# Patient Record
Sex: Male | Born: 1975 | Race: White | Hispanic: No | Marital: Married | Smoking: Never smoker
Health system: Southern US, Community
[De-identification: ages and names within clinical notes are randomized; demographics above are authoritative.]

## PROBLEM LIST (undated history)

## (undated) DIAGNOSIS — E079 Disorder of thyroid, unspecified: Secondary | ICD-10-CM

## (undated) HISTORY — PX: CHOLECYSTECTOMY: SHX55

## (undated) HISTORY — PX: KNEE ARTHROSCOPY: SUR90

## (undated) HISTORY — DX: Disorder of thyroid, unspecified: E07.9

---

## 2006-12-22 ENCOUNTER — Ambulatory Visit: Payer: Self-pay | Admitting: Internal Medicine

## 2006-12-27 ENCOUNTER — Other Ambulatory Visit: Payer: Self-pay

## 2006-12-27 ENCOUNTER — Emergency Department: Payer: Self-pay | Admitting: Emergency Medicine

## 2007-01-02 ENCOUNTER — Ambulatory Visit: Payer: Self-pay | Admitting: Vascular Surgery

## 2014-11-12 ENCOUNTER — Ambulatory Visit: Payer: Self-pay | Admitting: Internal Medicine

## 2015-06-15 IMAGING — US US EXTREM LOW VENOUS*L*
1 series · 13 of 24 positions shown · non-contrast
Comparison: None.

CLINICAL DATA: Left lower extremity popliteal fossa pain and
swelling.



[Series 1: us extrem low venous*left* · 0.09mm/px · 13 of 33 slices shown]
[im 1/33]
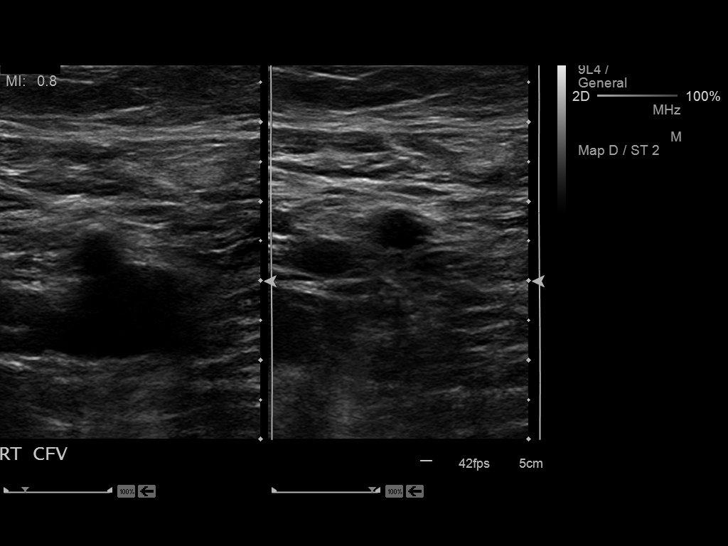
[im 3/33]
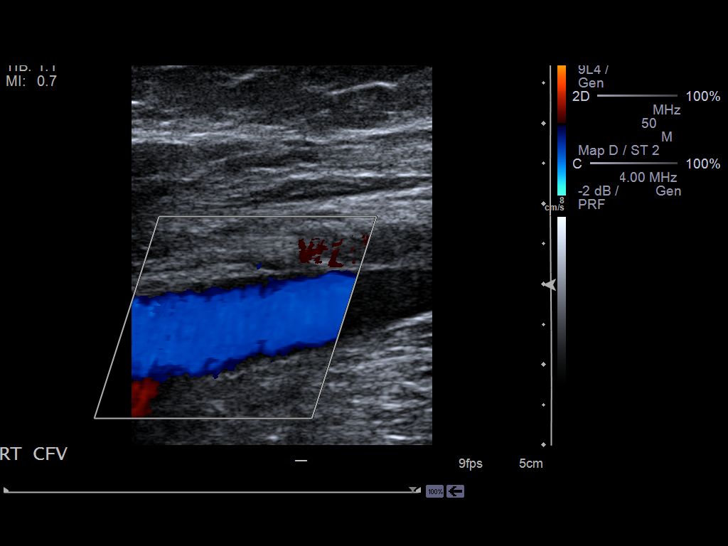
[im 6/33]
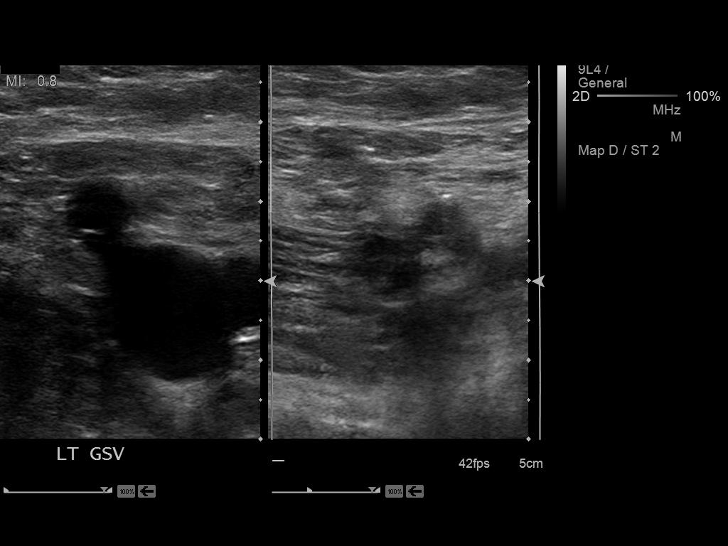
[im 9/33]
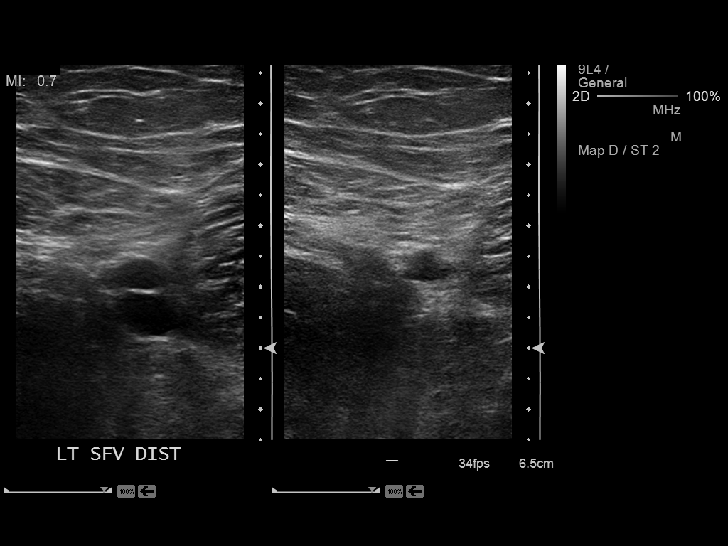
[im 12/33]
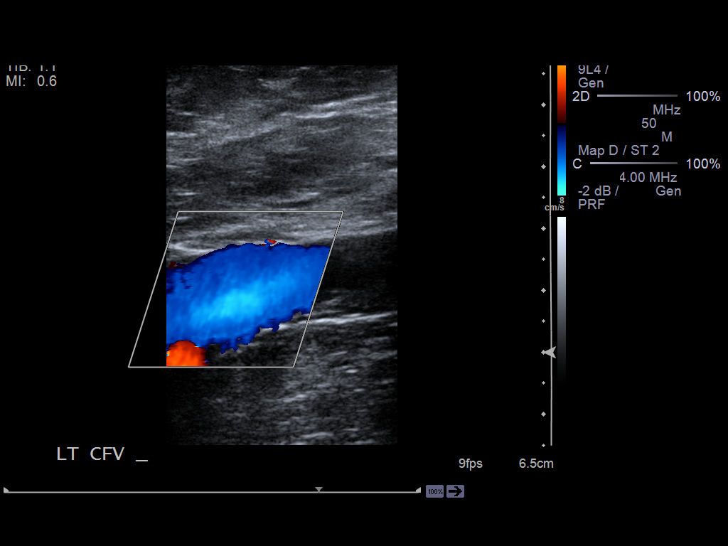
[im 14/33]
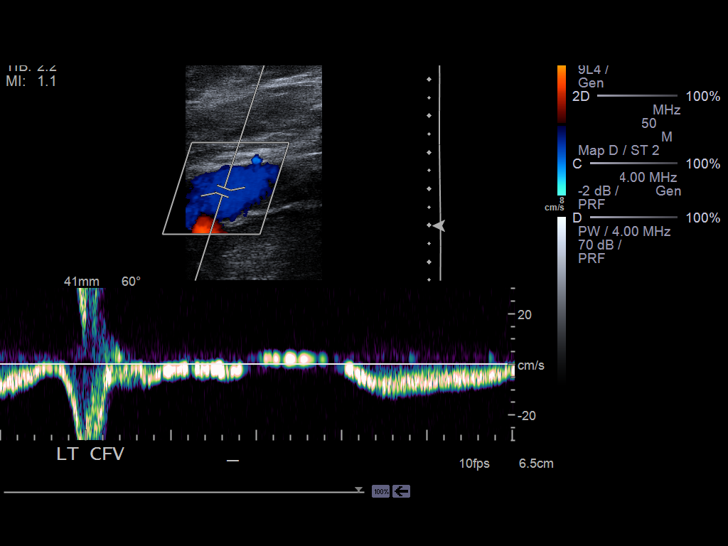
[im 17/33]
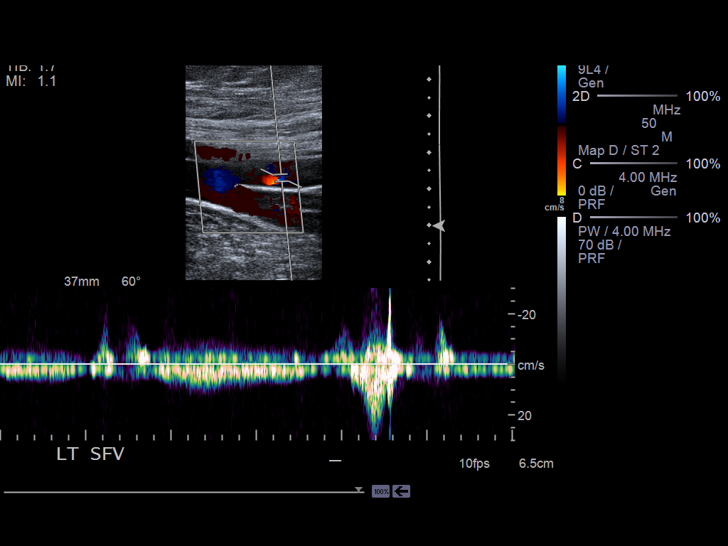
[im 19/33]
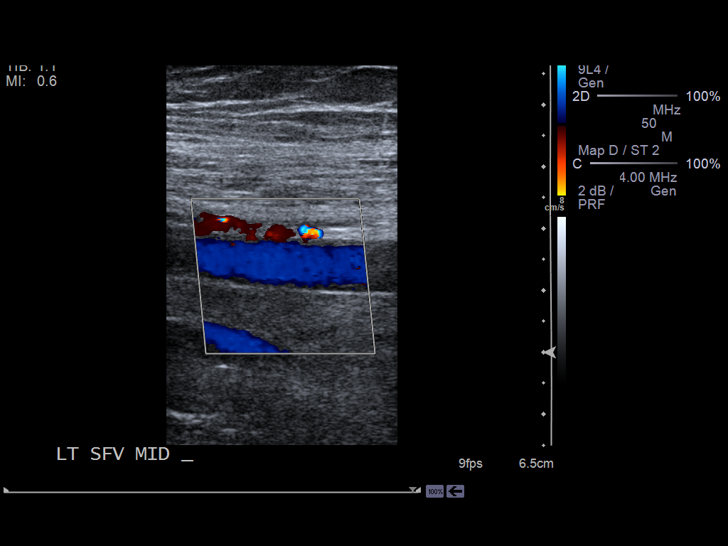
[im 21/33]
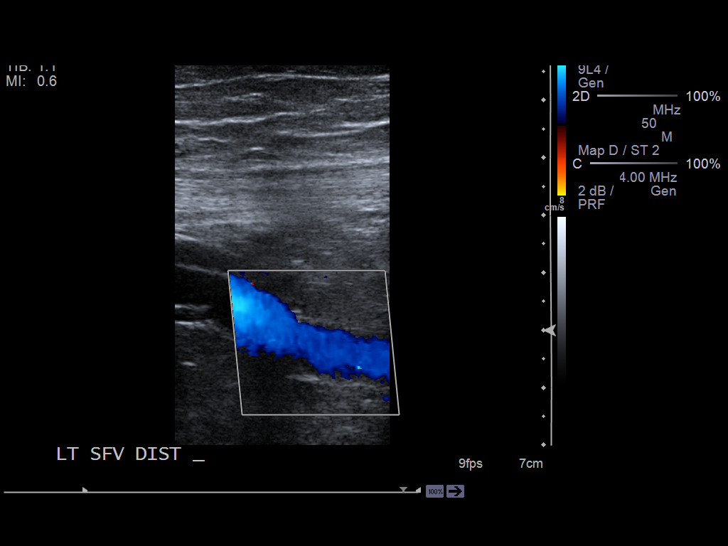
[im 24/33]
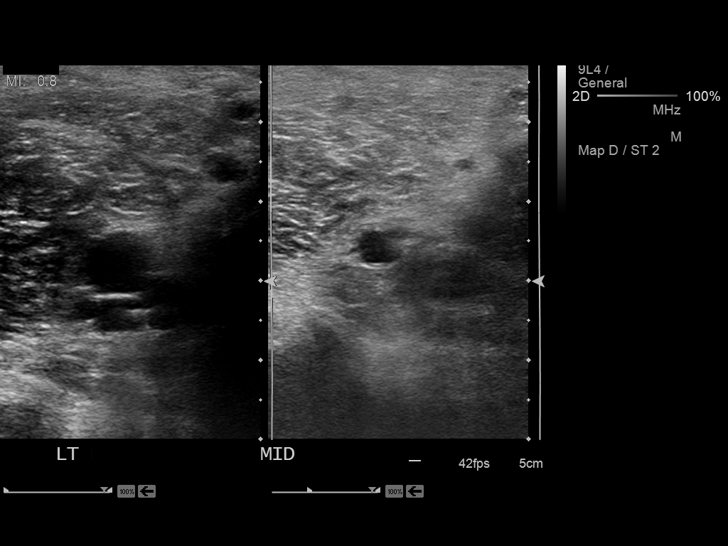
[im 27/33]
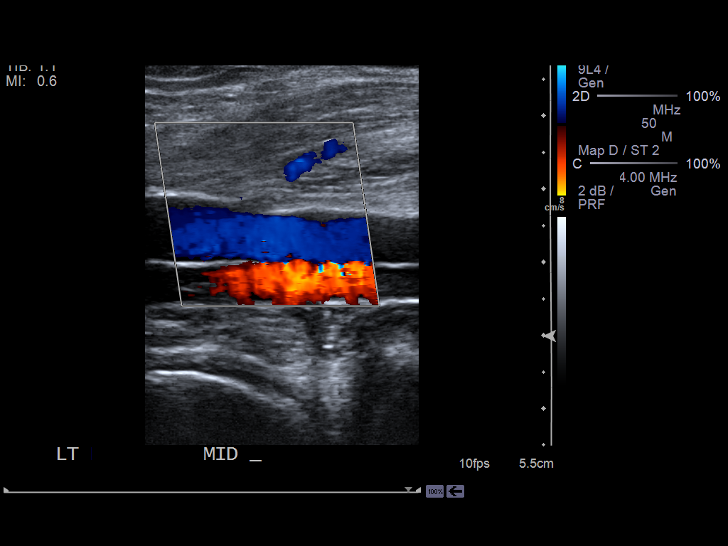
[im 30/33]
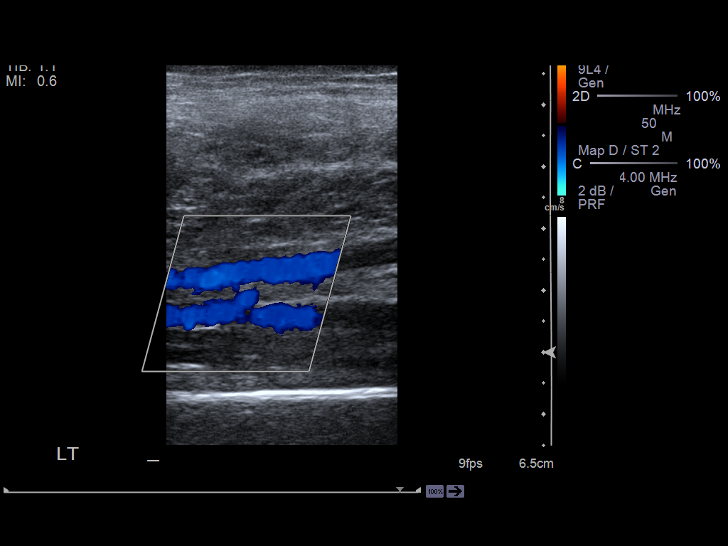
[im 33/33]
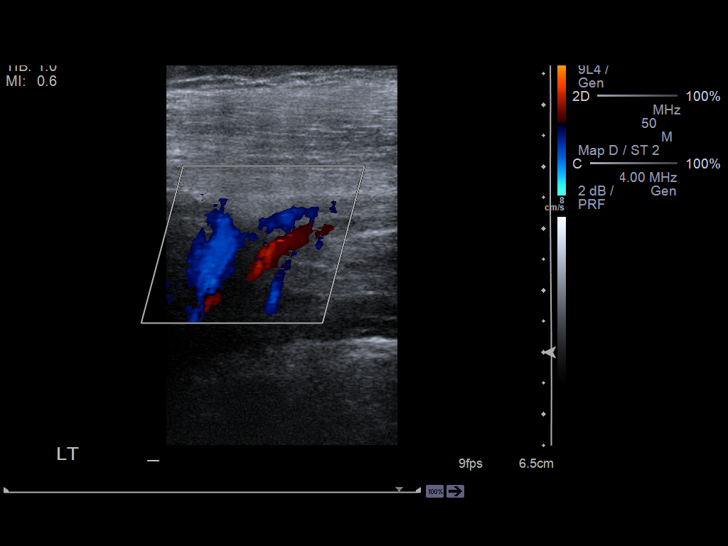

[13 of 24 positions shown; findings below may reference images not displayed]

FINDINGS: Contralateral Common Femoral Vein: Respiratory phasicity is normal
and symmetric with the symptomatic side. No evidence of thrombus.
Normal compressibility.

Common Femoral Vein: No evidence of thrombus. Normal
compressibility, respiratory phasicity and response to augmentation.

Saphenofemoral Junction: No evidence of thrombus. Normal
compressibility and flow on color Doppler imaging.

Profunda Femoral Vein: No evidence of thrombus. Normal
compressibility and flow on color Doppler imaging.

Femoral Vein: No evidence of thrombus. Normal compressibility,
respiratory phasicity and response to augmentation.

Popliteal Vein: No evidence of thrombus. Normal compressibility,
respiratory phasicity and response to augmentation.

Calf Veins: No evidence of thrombus. Normal compressibility and flow
on color Doppler imaging.

Superficial Great Saphenous Vein: No evidence of thrombus. Normal
compressibility and flow on color Doppler imaging.

Venous Reflux:  None.

Other Findings:  None.
IMPRESSION: No evidence of deep venous thrombosis.

## 2016-12-07 ENCOUNTER — Ambulatory Visit (INDEPENDENT_AMBULATORY_CARE_PROVIDER_SITE_OTHER): Payer: BLUE CROSS/BLUE SHIELD

## 2016-12-07 ENCOUNTER — Encounter: Payer: Self-pay | Admitting: Podiatry

## 2016-12-07 ENCOUNTER — Ambulatory Visit (INDEPENDENT_AMBULATORY_CARE_PROVIDER_SITE_OTHER): Payer: BLUE CROSS/BLUE SHIELD | Admitting: Podiatry

## 2016-12-07 DIAGNOSIS — M79672 Pain in left foot: Secondary | ICD-10-CM | POA: Diagnosis not present

## 2016-12-07 DIAGNOSIS — M722 Plantar fascial fibromatosis: Secondary | ICD-10-CM | POA: Diagnosis not present

## 2016-12-07 MED ORDER — MELOXICAM 15 MG PO TABS: 15.0000 mg | ORAL_TABLET | Freq: Every day | ORAL | 3 refills | Status: DC

## 2016-12-07 MED ORDER — METHYLPREDNISOLONE 4 MG PO TBPK: ORAL_TABLET | ORAL | 0 refills | Status: DC

## 2016-12-07 NOTE — Progress Notes (Signed)
He presents today with a chief complaint of pain to the left lateral foot and heel times past 2 years. He states that he has a history of plantar fasciitis. At that time the plantar fascia was more of the problem rather than the heel itself. He states the pain is sharp and stabbing when he is walking status is very painful in the mornings and after laying down. No treatment use recently.  Objective: Vital signs are stable alert and oriented 3. Pulses are palpable. Neurologic sensorium is intact. Degenerative reflexes are intact. Muscle strength is normal bilateral. Orthopedic evaluation demonstrates all joints distal to the ankle for range of motion without crepitation. Cutaneous evaluation Mr. is a well-hydrated cutis no open lesions or wounds. He has moderate severe pain on palpation medial calcaneal tubercle of the left heel. No pain on medial and lateral compression of the calcaneus.  Assessment: Plantar fasciitis left.  Plan: Start him on a Medrol Dosepak to be followed by meloxicam. Injected the left heel today with Kenalog and local anesthetic placed in a plantar fascia brace and a night splint. Follow up with him in 1 month. Discussed appropriate shoe gear stretching exercises ice therapy and shoe gear modifications.

## 2016-12-07 NOTE — Patient Instructions (Signed)

## 2017-01-04 ENCOUNTER — Ambulatory Visit: Payer: BLUE CROSS/BLUE SHIELD | Admitting: Podiatry

## 2020-01-24 ENCOUNTER — Ambulatory Visit: Payer: Self-pay | Attending: Internal Medicine

## 2020-01-28 ENCOUNTER — Ambulatory Visit: Payer: Self-pay | Attending: Internal Medicine

## 2020-01-28 DIAGNOSIS — Z23 Encounter for immunization: Secondary | ICD-10-CM

## 2020-01-28 NOTE — Progress Notes (Signed)
   Covid-19 Vaccination Clinic  Name:  Peter Hansen    MRN: [id number] DOB: 1975/10/12  01/28/2020  [name] was observed post Covid-19 immunization for 15 minutes without incident. He was provided with Vaccine Information Sheet and instruction to access the V-Safe system.   [name] was instructed to call 911 with any severe reactions post vaccine: Marland Kitchen Difficulty breathing  . Swelling of face and throat  . A fast heartbeat  . A bad rash all over body  . Dizziness and weakness   Immunizations Administered    Name Date Dose VIS Date Route   Pfizer COVID-19 Vaccine 01/28/2020  3:07 PM 0.3 mL 11/13/2018 Intramuscular   Manufacturer: [employer], [employer]   Lot: [id number]   NDC: [address]-9

## 2020-02-18 ENCOUNTER — Ambulatory Visit: Payer: Self-pay | Attending: Internal Medicine

## 2020-02-18 DIAGNOSIS — Z23 Encounter for immunization: Secondary | ICD-10-CM

## 2020-02-18 NOTE — Progress Notes (Signed)
   Covid-19 Vaccination Clinic  Name:  Peter Hansen    MRN: [id number] DOB: 02-Oct-1975  02/18/2020  Peter Hansen was observed post Covid-19 immunization for 15 minutes without incident. He was provided with Vaccine Information Sheet and instruction to access the V-Safe system.   Peter Hansen was instructed to call 911 with any severe reactions post vaccine: Marland Kitchen Difficulty breathing  . Swelling of face and throat  . A fast heartbeat  . A bad rash all over body  . Dizziness and weakness   Immunizations Administered    Name Date Dose VIS Date Route   Pfizer COVID-19 Vaccine 02/18/2020 12:12 PM 0.3 mL 11/13/2018 Intramuscular   Manufacturer: [employer], [employer]   Lot: [id number]   NDC: [id number]

## 2020-04-13 ENCOUNTER — Encounter: Payer: Self-pay | Admitting: Podiatry

## 2020-04-13 ENCOUNTER — Ambulatory Visit: Payer: BC Managed Care – PPO | Admitting: Podiatry

## 2020-04-13 ENCOUNTER — Other Ambulatory Visit: Payer: Self-pay

## 2020-04-13 DIAGNOSIS — B070 Plantar wart: Secondary | ICD-10-CM | POA: Diagnosis not present

## 2020-04-13 DIAGNOSIS — L989 Disorder of the skin and subcutaneous tissue, unspecified: Secondary | ICD-10-CM | POA: Diagnosis not present

## 2020-04-13 DIAGNOSIS — B079 Viral wart, unspecified: Secondary | ICD-10-CM | POA: Insufficient documentation

## 2020-04-13 NOTE — Progress Notes (Addendum)
This patient presents to the office with painful skin lesion under big toe joint left foot.  Patient says the skin lesion has been present for 4 months.  He says he believes he has a wart and has treated the skin lesion with debridement, soaks  and OTC acid.  Problem persists.  Patient says pain occurs when he walks.  He presents to the office for evaluation and treatment of skin lesion left foot.  Vascular  Dorsalis pedis and posterior tibial pulses are palpable  B/L.  Capillary return  WNL.  Temperature gradient is  WNL.  Skin turgor  WNL  Sensorium  Senn Weinstein monofilament wire  WNL. Normal tactile sensation.  Nail Exam  Patient has normal nails with no evidence of bacterial or fungal infection.  Orthopedic  Exam  Muscle tone and muscle strength  WNL.  No limitations of motion feet  B/L.  No crepitus or joint effusion noted.  Foot type is unremarkable and digits show no abnormalities.  Bony prominences are unremarkable.  Skin  No open lesions.  Normal skin texture and turgor. Well defined well circumscribed lesion left forefoot  With black thrombi in the center of skin lesion.  Benign skin lesion   Plantar wart left forefoot.    IE.  Discussed conservative vs. Surgical treatment.  Patient opts for excision of skin lesion under local anesthesia.  This patient was anesthetized with mixture of 2% lidocaine plain and 2 % lidocaine with epi. at the site of the skin lesion.  The surgical site was then washed with betadine and alcohol.  Using a punch the lesion was excised and passed off as specimen.  The surgical site was cauterized with phenol and washed with alcohol.  The site was bandaged with neosporin, sterile 2x2 and kling.  Home instructions given.  RTC 10 days.  The skin lesion was not sent to the lab after discussing possibly  sending the skin lesion to the lab.   Helane Gunther DPM

## 2020-04-23 ENCOUNTER — Ambulatory Visit: Payer: BC Managed Care – PPO | Admitting: Podiatry

## 2020-09-28 ENCOUNTER — Ambulatory Visit: Payer: Self-pay | Attending: Internal Medicine

## 2020-09-28 DIAGNOSIS — Z23 Encounter for immunization: Secondary | ICD-10-CM

## 2020-09-28 NOTE — Progress Notes (Signed)
   Covid-19 Vaccination Clinic  Name:  Peter Hansen    MRN: [id number] DOB: 11-09-1975  09/28/2020  [name] was observed post Covid-19 immunization for 15 minutes without incident. He was provided with Vaccine Information Sheet and instruction to access the V-Safe system.   [name] was instructed to call 911 with any severe reactions post vaccine: Marland Kitchen Difficulty breathing  . Swelling of face and throat  . A fast heartbeat  . A bad rash all over body  . Dizziness and weakness   Immunizations Administered    Name Date Dose VIS Date Route   Pfizer COVID-19 Vaccine 09/28/2020  3:36 PM 0.3 mL 07/08/2020 Intramuscular   Manufacturer: [employer], [employer]   Lot: [id number]   NDC: [address]-9

## 2021-11-22 ENCOUNTER — Ambulatory Visit
Admission: RE | Admit: 2021-11-22 | Discharge: 2021-11-22 | Disposition: A | Discharge status: Home / Self Care | Payer: BC Managed Care – PPO | Source: Ambulatory Visit | Attending: Internal Medicine | Admitting: Internal Medicine

## 2021-11-22 ENCOUNTER — Other Ambulatory Visit: Payer: Self-pay | Admitting: Internal Medicine

## 2021-11-22 DIAGNOSIS — M545 Low back pain, unspecified: Secondary | ICD-10-CM

## 2023-03-06 ENCOUNTER — Ambulatory Visit: Payer: BC Managed Care – PPO | Admitting: Podiatry

## 2023-03-06 ENCOUNTER — Encounter: Payer: Self-pay | Admitting: Podiatry

## 2023-03-06 DIAGNOSIS — D2371 Other benign neoplasm of skin of right lower limb, including hip: Secondary | ICD-10-CM

## 2023-03-06 DIAGNOSIS — M7751 Other enthesopathy of right foot: Secondary | ICD-10-CM | POA: Diagnosis not present

## 2023-03-06 MED ORDER — DEXAMETHASONE SODIUM PHOSPHATE 120 MG/30ML IJ SOLN
2.0000 mg | Freq: Once | INTRAMUSCULAR | Status: AC
  Administered 2023-03-06: 2 mg via INTRA_ARTICULAR

## 2023-03-06 NOTE — Progress Notes (Signed)
He presents today chief complaint of a painful lesion beneath the fifth metatarsal head of the right foot.  States that it is callused and painful area times past 2 months possibly wart and since he has had 1 before.  He tried to freeze away to the nail.  Objective: Vital signs stable alert oriented x 3 there is no erythema edema cellulitis drainage or odor is fluctuance beneath the fifth metatarsal head of the right foot.  There is a small porokeratotic lesion which I was able to sharply removed today.  Assessment: Bursitis subfifth met head right.  Benign skin lesion subfifth right.  Plan: Discussed etiology pathology and surgical therapies injected 3 mg of dexamethasone local anesthetic beneath the lesion today into the bursa.  I also was able to remove the nucleus from the benign skin lesion placed a Band-Aid no iatrogenic lesions.  Will follow-up with him on an as-needed basis discussed appropriate shoe gear

## 2023-10-04 ENCOUNTER — Telehealth: Payer: Self-pay

## 2023-10-04 NOTE — Telephone Encounter (Signed)
 Colonoscopy referral was received from Dr. Harding Li office.  Chart reviewed.  Patient was contacted to advise that if he would like his colonoscopy performed with our providers I will need to get approval due to the fact that he was seen by [facility] 05/14/21.  Explained to him that he is still an established patient with [facility] since he within the past 3 years.  Informed him that this is not a guarantee that we are able to perform his colonoscopy.  Please advise if either of you are willing to perform colonoscopy.  Thanks,  [employer], [place]

## 2023-10-09 ENCOUNTER — Other Ambulatory Visit: Payer: Self-pay

## 2023-10-09 ENCOUNTER — Telehealth: Payer: Self-pay

## 2023-10-09 DIAGNOSIS — Z1211 Encounter for screening for malignant neoplasm of colon: Secondary | ICD-10-CM

## 2023-10-09 MED ORDER — NA SULFATE-K SULFATE-MG SULF 17.5-3.13-1.6 GM/177ML PO SOLN: 1.0000 | Freq: Once | ORAL | 0 refills | Status: AC

## 2023-10-09 NOTE — Telephone Encounter (Signed)
Gastroenterology Pre-Procedure Review  Request Date: 10/24/23 Requesting Physician: Dr. Tobi Bastos  PATIENT REVIEW QUESTIONS: The patient responded to the following health history questions as indicated:    1. Are you having any GI issues? no 2. Do you have a personal history of Polyps? no 3. Do you have a family history of Colon Cancer or Polyps? no 4. Diabetes Mellitus? no 5. Joint replacements in the past 12 months?no 6. Major health problems in the past 3 months?no 7. Any artificial heart valves, MVP, or defibrillator?no    MEDICATIONS & ALLERGIES:    Patient reports the following regarding taking any anticoagulation/antiplatelet therapy:   Plavix, Coumadin, Eliquis, Xarelto, Lovenox, Pradaxa, Brilinta, or Effient? no Aspirin? no  Patient confirms/reports the following medications:  Current Outpatient Medications  Medication Sig Dispense Refill   levothyroxine (SYNTHROID) 137 MCG tablet Take 137 mcg by mouth daily.     No current facility-administered medications for this visit.    Patient confirms/reports the following allergies:  No Known Allergies  No orders of the defined types were placed in this encounter.   AUTHORIZATION INFORMATION Primary Insurance: 1D#: Group #:  Secondary Insurance: 1D#: Group #:  SCHEDULE INFORMATION: Date: 10/24/23 Time: Location: [facility]

## 2023-10-18 ENCOUNTER — Encounter: Payer: Self-pay | Admitting: Internal Medicine

## 2023-10-24 ENCOUNTER — Encounter
Admission: RE | Disposition: A | Discharge status: Home / Self Care | Payer: Self-pay | Source: Home / Self Care | Attending: Gastroenterology

## 2023-10-24 ENCOUNTER — Encounter: Payer: Self-pay | Admitting: Gastroenterology

## 2023-10-24 ENCOUNTER — Ambulatory Visit: Payer: BC Managed Care – PPO | Admitting: Anesthesiology

## 2023-10-24 ENCOUNTER — Ambulatory Visit
Admission: RE | Admit: 2023-10-24 | Discharge: 2023-10-24 | Disposition: A | Discharge status: Home / Self Care | Payer: BC Managed Care – PPO | Attending: Gastroenterology | Admitting: Gastroenterology

## 2023-10-24 DIAGNOSIS — E039 Hypothyroidism, unspecified: Secondary | ICD-10-CM | POA: Insufficient documentation

## 2023-10-24 DIAGNOSIS — Z79890 Hormone replacement therapy (postmenopausal): Secondary | ICD-10-CM | POA: Insufficient documentation

## 2023-10-24 DIAGNOSIS — D125 Benign neoplasm of sigmoid colon: Secondary | ICD-10-CM | POA: Insufficient documentation

## 2023-10-24 DIAGNOSIS — D123 Benign neoplasm of transverse colon: Secondary | ICD-10-CM | POA: Insufficient documentation

## 2023-10-24 DIAGNOSIS — D126 Benign neoplasm of colon, unspecified: Secondary | ICD-10-CM

## 2023-10-24 DIAGNOSIS — Z1211 Encounter for screening for malignant neoplasm of colon: Secondary | ICD-10-CM | POA: Diagnosis present

## 2023-10-24 DIAGNOSIS — K635 Polyp of colon: Secondary | ICD-10-CM

## 2023-10-24 DIAGNOSIS — D120 Benign neoplasm of cecum: Secondary | ICD-10-CM | POA: Diagnosis not present

## 2023-10-24 HISTORY — PX: COLONOSCOPY WITH PROPOFOL: SHX5780

## 2023-10-24 HISTORY — PX: POLYPECTOMY: SHX5525

## 2023-10-24 SURGERY — COLONOSCOPY WITH PROPOFOL
Anesthesia: General

## 2023-10-24 MED ORDER — SODIUM CHLORIDE 0.9 % IV SOLN: INTRAVENOUS | Status: DC

## 2023-10-24 MED ORDER — STERILE WATER FOR IRRIGATION IR SOLN
Status: DC | PRN
  Administered 2023-10-24 (×2): 60 mL

## 2023-10-24 MED ORDER — PROPOFOL 10 MG/ML IV BOLUS
INTRAVENOUS | Status: DC | PRN
  Administered 2023-10-24: 70 mg via INTRAVENOUS

## 2023-10-24 MED ORDER — PROPOFOL 500 MG/50ML IV EMUL
INTRAVENOUS | Status: DC | PRN
  Administered 2023-10-24: 140 ug/kg/min via INTRAVENOUS

## 2023-10-24 MED ORDER — SODIUM CHLORIDE 0.9% FLUSH: 3.0000 mL | INTRAVENOUS | Status: DC | PRN

## 2023-10-24 MED ORDER — SODIUM CHLORIDE 0.9% FLUSH: 3.0000 mL | Freq: Two times a day (BID) | INTRAVENOUS | Status: DC

## 2023-10-24 NOTE — Anesthesia Postprocedure Evaluation (Signed)
 Anesthesia Post Note  Patient: Peter Hansen  Procedure(s) Performed: COLONOSCOPY WITH PROPOFOL  POLYPECTOMY  Patient location during evaluation: Endoscopy Anesthesia Type: General Level of consciousness: awake and alert Pain management: pain level controlled Vital Signs Assessment: post-procedure vital signs reviewed and stable Respiratory status: spontaneous breathing, nonlabored ventilation, respiratory function stable and patient connected to nasal cannula oxygen Cardiovascular status: blood pressure returned to baseline and stable Postop Assessment: no apparent nausea or vomiting Anesthetic complications: no   No notable events documented.   Last Vitals:  Vitals:   10/24/23 1200 10/24/23 1203  BP:  115/82  Pulse:    Resp:  (!) 22  Temp:    SpO2: (P) 100% 98%    Last Pain:  Vitals:   10/24/23 1143  TempSrc: Temporal                 [name]

## 2023-10-24 NOTE — Transfer of Care (Signed)
 Immediate Anesthesia Transfer of Care Note  Patient: Peter Hansen  Procedure(s) Performed: COLONOSCOPY WITH PROPOFOL  POLYPECTOMY  Patient Location: PACU  Anesthesia Type:General  Level of Consciousness: awake, alert , and oriented  Airway & Oxygen Therapy: Patient Spontanous Breathing  Post-op Assessment: Report given to RN and Post -op Vital signs reviewed and stable  Post vital signs: Reviewed and stable  Last Vitals:  Vitals Value Taken Time  BP 100/61 10/24/23 1143  Temp 35.8 C 10/24/23 1143  Pulse 67 10/24/23 1146  Resp 12 10/24/23 1146  SpO2 100 % 10/24/23 1146  Vitals shown include unfiled device data.  Last Pain:  Vitals:   10/24/23 1143  TempSrc: Temporal         Complications: No notable events documented.

## 2023-10-24 NOTE — Anesthesia Preprocedure Evaluation (Signed)
Anesthesia Evaluation  Patient identified by MRN, date of birth, ID band Patient awake    Reviewed: Allergy & Precautions, NPO status , Patient's Chart, lab work & pertinent test results  History of Anesthesia Complications Negative for: history of anesthetic complications  Airway Mallampati: II  TM Distance: >3 FB Neck ROM: full    Dental no notable dental hx.    Pulmonary neg pulmonary ROS   Pulmonary exam normal        Cardiovascular negative cardio ROS Normal cardiovascular exam     Neuro/Psych negative neurological ROS  negative psych ROS   GI/Hepatic negative GI ROS, Neg liver ROS,,,  Endo/Other  Hypothyroidism    Renal/GU negative Renal ROS  negative genitourinary   Musculoskeletal   Abdominal   Peds  Hematology negative hematology ROS (+)   Anesthesia Other Findings No past medical history on file.  Past Surgical History: No date: CHOLECYSTECTOMY No date: KNEE ARTHROSCOPY  BMI    Body Mass Index: 27.10 kg/m      Reproductive/Obstetrics negative OB ROS                             Anesthesia Physical Anesthesia Plan  ASA: 2  Anesthesia Plan: General   Post-op Pain Management: Minimal or no pain anticipated   Induction: Intravenous  PONV Risk Score and Plan: 1 and Propofol infusion and TIVA  Airway Management Planned: Natural Airway and Nasal Cannula  Additional Equipment:   Intra-op Plan:   Post-operative Plan:   Informed Consent: I have reviewed the patients History and Physical, chart, labs and discussed the procedure including the risks, benefits and alternatives for the proposed anesthesia with the patient or authorized representative who has indicated his/her understanding and acceptance.     Dental Advisory Given  Plan Discussed with: Anesthesiologist, CRNA and Surgeon  Anesthesia Plan Comments: (Patient consented for risks of anesthesia including  but not limited to:  - adverse reactions to medications - risk of airway placement if required - damage to eyes, teeth, lips or other oral mucosa - nerve damage due to positioning  - sore throat or hoarseness - Damage to heart, brain, nerves, lungs, other parts of body or loss of life  Patient voiced understanding and assent.)       Anesthesia Quick Evaluation

## 2023-10-24 NOTE — H&P (Signed)
     [place]ung, MD [address], [address], [place], [place], [address] [address], [place], [place], [address] Phone: [phone]  Fax: [phone]  Primary Care Physician:  Weyman Bright, MD   Pre-Procedure History & Physical: HPI:  Peter Hansen is a 48 y.o. male is here for an colonoscopy.   No past medical history on file.  No past surgical history on file.  Prior to Admission medications   Medication Sig Start Date End Date Taking? Authorizing Provider  levothyroxine  (SYNTHROID ) 137 MCG tablet Take 137 mcg by mouth daily. 04/07/20   [provider]    Allergies as of 10/09/2023   (No Known Allergies)    No family history on file.  Social History   Socioeconomic History   Marital status: Married    Spouse name: Not on file   Number of children: Not on file   Years of education: Not on file   Highest education level: Not on file  Occupational History   Not on file  Tobacco Use   Smoking status: Never   Smokeless tobacco: Never  Substance and Sexual Activity   Alcohol use: Yes    Comment: socially   Drug use: No   Sexual activity: Not on file  Other Topics Concern   Not on file  Social History Narrative   Not on file   Social Drivers of Health   Financial Resource Strain: Not on file  Food Insecurity: Not on file  Transportation Needs: Not on file  Physical Activity: Not on file  Stress: Not on file  Social Connections: Not on file  Intimate Partner Violence: Not on file    Review of Systems: See HPI, otherwise negative ROS  Physical Exam: There were no vitals taken for this visit. General:   Alert,  pleasant and cooperative in NAD Head:  Normocephalic and atraumatic. Neck:  Supple; no masses or thyromegaly. Lungs:  Clear throughout to auscultation, normal respiratory effort.    Heart:  +S1, +S2, Regular rate and rhythm, No edema. Abdomen:  Soft, nontender and nondistended. Normal bowel sounds, without guarding, and  without rebound.   Neurologic:  Alert and  oriented x4;  grossly normal neurologically.  Impression/Plan: Peter Hansen is here for an colonoscopy to be performed for Screening colonoscopy average risk   Risks, benefits, limitations, and alternatives regarding  colonoscopy have been reviewed with the patient.  Questions have been answered.  All parties agreeable.   Ruel Kung, MD  10/24/2023, 10:22 AM

## 2023-10-24 NOTE — Op Note (Signed)
 [facility] Patient Name: Peter Hansen Procedure Date: 10/24/2023 11:11 AM MRN: [id number] Account #: [id number] Date of Birth: 1976/05/18 Admit Type: Outpatient Age: 48 Room: [facility] ENDO ROOM 2 Gender: Male Note Status: Finalized Instrument Name: [id number] Procedure:             Colonoscopy Indications:           Screening for colorectal malignant neoplasm Providers:             Ruel Kung MD, MD Referring MD:          Ruel Kung MD, MD (Referring MD), Stevan Haver, MD                         (Referring MD) Medicines:             Monitored Anesthesia Care Complications:         No immediate complications. Procedure:             Pre-Anesthesia Assessment:                        - Prior to the procedure, a History and Physical was                         performed, and patient medications, allergies and                         sensitivities were reviewed. The patient's tolerance                         of previous anesthesia was reviewed.                        - The risks and benefits of the procedure and the                         sedation options and risks were discussed with the                         patient. All questions were answered and informed                         consent was obtained.                        - ASA Grade Assessment: II - A patient with mild                         systemic disease.                        After obtaining informed consent, the colonoscope was                         passed under direct vision. Throughout the procedure,                         the patient's blood pressure, pulse, and oxygen                         saturations were  monitored continuously. The                         Colonoscope was introduced through the anus and                         advanced to the the cecum, identified by the                         appendiceal orifice. The colonoscopy was performed                          with ease. The patient tolerated the procedure well.                         The quality of the bowel preparation was excellent.                         The ileocecal valve, appendiceal orifice, and rectum                         were photographed. Findings:      The perianal and digital rectal examinations were normal.      Two sessile polyps were found in the cecum. The polyps were 4 to 5 mm in       size. These polyps were removed with a cold snare. Resection and       retrieval were complete.      Two sessile polyps were found in the sigmoid colon and transverse colon.       The polyps were 4 to 5 mm in size. These polyps were removed with a cold       snare. Resection and retrieval were complete.      The exam was otherwise without abnormality. Impression:            - Two 4 to 5 mm polyps in the cecum, removed with a                         cold snare. Resected and retrieved.                        - Two 4 to 5 mm polyps in the sigmoid colon and in the                         transverse colon, removed with a cold snare. Resected                         and retrieved.                        - The examination was otherwise normal. Recommendation:        - Discharge patient to home (with escort).                        - Resume previous diet.                        - Continue present medications.                        -  Await pathology results.                        - Repeat colonoscopy in 3 years for surveillance. Procedure Code(s):     --- Professional ---                        [phone], Colonoscopy, flexible; with removal of                         tumor(s), polyp(s), or other lesion(s) by snare                         technique Diagnosis Code(s):     --- Professional ---                        Z12.11, Encounter for screening for malignant neoplasm                         of colon                        D12.0, Benign neoplasm of cecum                        D12.5, Benign  neoplasm of sigmoid colon                        D12.3, Benign neoplasm of transverse colon (hepatic                         flexure or splenic flexure) CPT copyright 2022 American Medical Association. All rights reserved. The codes documented in this report are preliminary and upon coder review may  be revised to meet current compliance requirements. Ruel Kung, MD Ruel Kung MD, MD 10/24/2023 11:42:24 AM This report has been signed electronically. Number of Addenda: 0 Note Initiated On: 10/24/2023 11:11 AM Scope Withdrawal Time: 0 hours 11 minutes 31 seconds  Total Procedure Duration: 0 hours 13 minutes 2 seconds  Estimated Blood Loss:  Estimated blood loss: none.      [facility]

## 2023-10-25 ENCOUNTER — Encounter: Payer: Self-pay | Admitting: Gastroenterology

## 2023-10-25 LAB — SURGICAL PATHOLOGY

## 2024-04-05 ENCOUNTER — Encounter: Payer: Self-pay | Admitting: Internal Medicine

## 2024-06-04 ENCOUNTER — Encounter: Payer: Self-pay | Admitting: Internal Medicine

## 2024-06-04 ENCOUNTER — Ambulatory Visit: Admitting: Internal Medicine

## 2024-06-04 VITALS — BP 119/87 | HR 64 | Temp 97.8°F | Ht 67.0 in | Wt 168.8 lb

## 2024-06-04 DIAGNOSIS — E039 Hypothyroidism, unspecified: Secondary | ICD-10-CM | POA: Diagnosis not present

## 2024-06-04 DIAGNOSIS — Z0130 Encounter for examination of blood pressure without abnormal findings: Secondary | ICD-10-CM

## 2024-06-04 NOTE — Progress Notes (Signed)
 New Patient Office Visit  Subjective:  Patient ID: Peter Hansen, male    DOB: 03/23/1976  Age: 48 y.o. MRN: [id number]  Chief Complaint  Patient presents with   Establish Care    NPE    No complaints, here fto establish IM care as his pcp retired.  PMH of Hypothyroidism and requests refills today.    No other concerns at this time.   Past Medical History:  Diagnosis Date   Thyroid disease     Past Surgical History:  Procedure Laterality Date   CHOLECYSTECTOMY     COLONOSCOPY WITH PROPOFOL  N/A 10/24/2023   Procedure: COLONOSCOPY WITH PROPOFOL ;  Surgeon: Therisa Bi, MD;  Location: [facility] ENDOSCOPY;  Service: Gastroenterology;  Laterality: N/A;   KNEE ARTHROSCOPY     POLYPECTOMY  10/24/2023   Procedure: POLYPECTOMY;  Surgeon: Therisa Bi, MD;  Location: [facility] ENDOSCOPY;  Service: Gastroenterology;;    Social History   Socioeconomic History   Marital status: Married    Spouse name: Not on file   Number of children: 1   Years of education: Not on file   Highest education level: GED or equivalent  Occupational History   Not on file  Tobacco Use   Smoking status: Never   Smokeless tobacco: Never  Vaping Use   Vaping status: Never Used  Substance and Sexual Activity   Alcohol use: Not Currently    Comment: socially   Drug use: No   Sexual activity: Yes  Other Topics Concern   Not on file  Social History Narrative   Not on file   Social Drivers of Health   Financial Resource Strain: Not on file  Food Insecurity: Not on file  Transportation Needs: Not on file  Physical Activity: Not on file  Stress: Not on file  Social Connections: Not on file  Intimate Partner Violence: Not on file    Family History  Problem Relation Age of Onset   Cancer Mother    Pancreatic cancer Mother    Cancer Father     No Known Allergies  Outpatient Medications Prior to Visit  Medication Sig   levothyroxine  (SYNTHROID ) 137 MCG tablet Take 137 mcg by mouth daily.   No  facility-administered medications prior to visit.    Review of Systems  Constitutional:  Positive for weight loss. Negative for malaise/fatigue.  HENT: Negative.    Eyes: Negative.   Respiratory: Negative.    Cardiovascular: Negative.   Gastrointestinal: Negative.   Genitourinary: Negative.   Musculoskeletal:  Positive for joint pain (both knees).  Skin: Negative.   Neurological: Negative.   Endo/Heme/Allergies:  Positive for environmental allergies.  Psychiatric/Behavioral: Negative.         Objective:   BP 119/87   Pulse 64   Temp 97.8 F (36.6 C)   Ht 5' 7 (1.702 m)   Wt 168 lb 12.8 oz (76.6 kg)   SpO2 98%   BMI 26.44 kg/m   Vitals:   06/04/24 1013  BP: 119/87  Pulse: 64  Temp: 97.8 F (36.6 C)  Height: 5' 7 (1.702 m)  Weight: 168 lb 12.8 oz (76.6 kg)  SpO2: 98%  BMI (Calculated): 26.43    Physical Exam Vitals reviewed.  Constitutional:      Appearance: Normal appearance.  HENT:     Head: Normocephalic.     Left Ear: There is no impacted cerumen.     Nose: Nose normal.     Mouth/Throat:     Mouth: Mucous membranes are  moist.     Pharynx: No posterior oropharyngeal erythema.  Eyes:     Extraocular Movements: Extraocular movements intact.     Pupils: Pupils are equal, round, and reactive to light.  Cardiovascular:     Rate and Rhythm: Regular rhythm.     Chest Wall: PMI is not displaced.     Pulses: Normal pulses.     Heart sounds: Normal heart sounds. No murmur heard. Pulmonary:     Effort: Pulmonary effort is normal.     Breath sounds: Normal air entry. No rhonchi or rales.  Abdominal:     General: Abdomen is flat. Bowel sounds are normal. There is no distension.     Palpations: Abdomen is soft. There is no hepatomegaly, splenomegaly or mass.     Tenderness: There is no abdominal tenderness.  Musculoskeletal:        General: Normal range of motion.     Cervical back: Normal range of motion and neck supple.     Right lower leg: No edema.      Left lower leg: No edema.  Skin:    General: Skin is warm and dry.  Neurological:     General: No focal deficit present.     Mental Status: He is alert and oriented to person, place, and time.     Cranial Nerves: No cranial nerve deficit.     Motor: No weakness.  Psychiatric:        Mood and Affect: Mood normal.        Behavior: Behavior normal.      No results found for any visits on 06/04/24.  No results found for this or any previous visit (from the past 2160 hours).    Assessment & Plan:  Peter Hansen was seen today for establish care.  Acquired hypothyroidism -     TSH -     TSH; Standing -     Comprehensive metabolic panel with GFR; Future -     Lipid panel; Future    Problem List Items Addressed This Visit       Endocrine   Acquired hypothyroidism - Primary   Relevant Orders   TSH   TSH   Comprehensive metabolic panel with GFR   Lipid panel    Return in about 3 months (around 09/03/2024) for fu with labs prior.   Total time spent: 40 minutes  Sherrill Cinderella Perry, MD  06/04/2024   This document may have been prepared by [facility] Voice Recognition software and as such may include unintentional dictation errors.

## 2024-06-05 ENCOUNTER — Other Ambulatory Visit: Payer: Self-pay | Admitting: Internal Medicine

## 2024-06-05 ENCOUNTER — Ambulatory Visit: Payer: Self-pay | Admitting: Internal Medicine

## 2024-06-05 DIAGNOSIS — E039 Hypothyroidism, unspecified: Secondary | ICD-10-CM

## 2024-06-05 LAB — TSH: TSH: 1.56 u[IU]/mL (ref 0.450–4.500)

## 2024-06-05 MED ORDER — LEVOTHYROXINE SODIUM 137 MCG PO TABS: 137.0000 ug | ORAL_TABLET | Freq: Every day | ORAL | 0 refills | Status: DC

## 2024-06-05 MED ORDER — LEVOTHYROXINE SODIUM 125 MCG PO TABS: 125.0000 ug | ORAL_TABLET | Freq: Every day | ORAL | 0 refills | Status: DC

## 2024-06-26 ENCOUNTER — Encounter: Payer: Self-pay | Admitting: Cardiology

## 2024-06-26 ENCOUNTER — Ambulatory Visit: Admitting: Cardiology

## 2024-06-26 VITALS — BP 102/68 | HR 66 | Ht 67.0 in | Wt 170.6 lb

## 2024-06-26 DIAGNOSIS — Z0130 Encounter for examination of blood pressure without abnormal findings: Secondary | ICD-10-CM

## 2024-06-26 DIAGNOSIS — B029 Zoster without complications: Secondary | ICD-10-CM | POA: Insufficient documentation

## 2024-06-26 MED ORDER — VALACYCLOVIR HCL 1 G PO TABS: 1000.0000 mg | ORAL_TABLET | Freq: Three times a day (TID) | ORAL | 0 refills | Status: AC

## 2024-06-26 NOTE — Progress Notes (Signed)
 Established Patient Office Visit  Subjective:  Patient ID: Peter Hansen, male    DOB: 03/25/1976  Age: 48 y.o. MRN: [id number]  Chief Complaint  Patient presents with   Acute Visit    reoccuring shingles, first time was in april-- tj pt    Patient in office for an acute visit, states he has shingles. Patient reports having shingles the beginning of the year, on left lower back. PCP at the time told him he was past the 72 hours to treat. Patient states he did not develop the vesicular rash, only unilateral burning. Patient comes in today complaining of burning in his left lower back, same as previous. Will send in Valtrex.     No other concerns at this time.   Past Medical History:  Diagnosis Date   Thyroid disease     Past Surgical History:  Procedure Laterality Date   CHOLECYSTECTOMY     COLONOSCOPY WITH PROPOFOL  N/A 10/24/2023   Procedure: COLONOSCOPY WITH PROPOFOL ;  Surgeon: Therisa Bi, MD;  Location: [facility] ENDOSCOPY;  Service: Gastroenterology;  Laterality: N/A;   KNEE ARTHROSCOPY     POLYPECTOMY  10/24/2023   Procedure: POLYPECTOMY;  Surgeon: Therisa Bi, MD;  Location: [facility] ENDOSCOPY;  Service: Gastroenterology;;    Social History   Socioeconomic History   Marital status: Married    Spouse name: Not on file   Number of children: 1   Years of education: Not on file   Highest education level: GED or equivalent  Occupational History   Not on file  Tobacco Use   Smoking status: Never   Smokeless tobacco: Never  Vaping Use   Vaping status: Never Used  Substance and Sexual Activity   Alcohol use: Not Currently    Comment: socially   Drug use: No   Sexual activity: Yes  Other Topics Concern   Not on file  Social History Narrative   Not on file   Social Drivers of Health   Financial Resource Strain: Not on file  Food Insecurity: Not on file  Transportation Needs: Not on file  Physical Activity: Not on file  Stress: Not on file  Social Connections: Not on  file  Intimate Partner Violence: Not on file    Family History  Problem Relation Age of Onset   Cancer Mother    Pancreatic cancer Mother    Cancer Father     No Known Allergies  Outpatient Medications Prior to Visit  Medication Sig   levothyroxine  (SYNTHROID ) 125 MCG tablet Take 1 tablet (125 mcg total) by mouth daily.   No facility-administered medications prior to visit.    Review of Systems  Constitutional: Negative.   HENT: Negative.    Eyes: Negative.   Respiratory: Negative.  Negative for shortness of breath.   Cardiovascular: Negative.  Negative for chest pain.  Gastrointestinal: Negative.  Negative for abdominal pain, constipation and diarrhea.  Genitourinary: Negative.   Musculoskeletal:  Negative for joint pain and myalgias.  Skin: Negative.   Neurological: Negative.  Negative for dizziness and headaches.  Endo/Heme/Allergies: Negative.   All other systems reviewed and are negative.      Objective:   BP 102/68   Pulse 66   Ht 5' 7 (1.702 m)   Wt 170 lb 9.6 oz (77.4 kg)   SpO2 97%   BMI 26.72 kg/m   Vitals:   06/26/24 1009  BP: 102/68  Pulse: 66  Height: 5' 7 (1.702 m)  Weight: 170 lb 9.6 oz (77.4 kg)  SpO2: 97%  BMI (Calculated): 26.71    Physical Exam Nursing note reviewed.  Constitutional:      Appearance: Normal appearance. He is normal weight.  HENT:     Head: Normocephalic and atraumatic.     Nose: Nose normal.     Mouth/Throat:     Mouth: Mucous membranes are moist.     Pharynx: Oropharynx is clear.  Eyes:     Extraocular Movements: Extraocular movements intact.     Conjunctiva/sclera: Conjunctivae normal.     Pupils: Pupils are equal, round, and reactive to light.  Cardiovascular:     Rate and Rhythm: Normal rate and regular rhythm.     Pulses: Normal pulses.     Heart sounds: Normal heart sounds.  Pulmonary:     Effort: Pulmonary effort is normal.     Breath sounds: Normal breath sounds.  Abdominal:     General:  Abdomen is flat. Bowel sounds are normal.     Palpations: Abdomen is soft.  Musculoskeletal:        General: Normal range of motion.     Cervical back: Normal range of motion.  Skin:    General: Skin is warm and dry.  Neurological:     General: No focal deficit present.     Mental Status: He is alert and oriented to person, place, and time.  Psychiatric:        Mood and Affect: Mood normal.        Behavior: Behavior normal.        Thought Content: Thought content normal.        Judgment: Judgment normal.      No results found for any visits on 06/26/24.  Recent Results (from the past 2160 hours)  TSH     Status: None   Collection Time: 06/04/24 11:23 AM  Result Value Ref Range   TSH 1.560 0.450 - 4.500 uIU/mL      Assessment & Plan:  Valtrex  Problem List Items Addressed This Visit       Nervous and Auditory   Herpes zoster without complication - Primary   Relevant Medications   valACYclovir (VALTREX) 1000 MG tablet    Return if symptoms worsen or fail to improve, for as scheduled.   Total time spent: 25 minutes  [employer], NP  06/26/2024   This document may have been prepared by Dragon Voice Recognition software and as such may include unintentional dictation errors.

## 2024-08-05 ENCOUNTER — Ambulatory Visit: Admitting: Podiatry

## 2024-08-05 ENCOUNTER — Ambulatory Visit

## 2024-08-05 DIAGNOSIS — M722 Plantar fascial fibromatosis: Secondary | ICD-10-CM | POA: Diagnosis not present

## 2024-08-05 MED ORDER — TRIAMCINOLONE ACETONIDE 40 MG/ML IJ SUSP
20.0000 mg | Freq: Once | INTRAMUSCULAR | Status: AC
  Administered 2024-08-05: 20 mg

## 2024-08-05 MED ORDER — MELOXICAM 15 MG PO TABS
15.0000 mg | ORAL_TABLET | Freq: Every day | ORAL | 3 refills | Status: AC
Start: 2024-08-05 — End: ?

## 2024-08-05 MED ORDER — METHYLPREDNISOLONE 4 MG PO TBPK: ORAL_TABLET | ORAL | 0 refills | Status: DC

## 2024-08-05 NOTE — Progress Notes (Signed)
 He presents today with a 76-month duration of pain to his right heel.  Denies taking any medication denies any trauma.  Objective: Vital signs are stable he is alert oriented x 3 he has pain on palpation medial calcaneal tubercle of the right heel.  No pain on medial-lateral compression of the calcaneus.  Radiographs demonstrate soft tissue increase in density plantar fascial canny insertion site no significant spurring is identified no fractures identified.  No acute findings.  Assessment: Plantar fasciitis right.  Plan: Started him on methylprednisolone  to be followed by meloxicam  injected his right heel today 20 mg Kenalog  5 mg Marcaine for maximal tenderness.  Placed him in a plantar fascial brace.

## 2024-09-02 ENCOUNTER — Ambulatory Visit: Admitting: Podiatry

## 2024-09-16 ENCOUNTER — Other Ambulatory Visit: Payer: Self-pay

## 2024-09-16 DIAGNOSIS — E039 Hypothyroidism, unspecified: Secondary | ICD-10-CM

## 2024-09-18 ENCOUNTER — Other Ambulatory Visit

## 2024-09-19 LAB — COMPREHENSIVE METABOLIC PANEL WITH GFR
ALT: 35 IU/L (ref 0–44)
AST: 28 IU/L (ref 0–40)
Albumin: 4.7 g/dL (ref 4.1–5.1)
Alkaline Phosphatase: 34 IU/L — ABNORMAL LOW (ref 47–123)
BUN/Creatinine Ratio: 17 (ref 9–20)
BUN: 10 mg/dL (ref 6–24)
Bilirubin Total: 0.8 mg/dL (ref 0.0–1.2)
CO2: 17 mmol/L — ABNORMAL LOW (ref 20–29)
Calcium: 9 mg/dL (ref 8.7–10.2)
Chloride: 104 mmol/L (ref 96–106)
Creatinine, Ser: 0.59 mg/dL — ABNORMAL LOW (ref 0.76–1.27)
Globulin, Total: 2.2 g/dL (ref 1.5–4.5)
Glucose: 94 mg/dL (ref 70–99)
Potassium: 4.5 mmol/L (ref 3.5–5.2)
Sodium: 141 mmol/L (ref 134–144)
Total Protein: 6.9 g/dL (ref 6.0–8.5)
eGFR: 121 mL/min/1.73

## 2024-09-19 LAB — LIPID PANEL
Chol/HDL Ratio: 2.9 ratio (ref 0.0–5.0)
Cholesterol, Total: 168 mg/dL (ref 100–199)
HDL: 57 mg/dL
LDL Chol Calc (NIH): 89 mg/dL (ref 0–99)
Triglycerides: 122 mg/dL (ref 0–149)
VLDL Cholesterol Cal: 22 mg/dL (ref 5–40)

## 2024-09-19 LAB — TSH: TSH: 4.72 u[IU]/mL — ABNORMAL HIGH (ref 0.450–4.500)

## 2024-09-23 ENCOUNTER — Ambulatory Visit: Admitting: Internal Medicine

## 2024-09-23 VITALS — BP 112/82 | HR 72 | Temp 98.2°F | Ht 67.0 in | Wt 177.6 lb

## 2024-09-23 DIAGNOSIS — H68101 Unspecified obstruction of Eustachian tube, right ear: Secondary | ICD-10-CM | POA: Diagnosis not present

## 2024-09-23 DIAGNOSIS — E039 Hypothyroidism, unspecified: Secondary | ICD-10-CM | POA: Diagnosis not present

## 2024-09-23 DIAGNOSIS — M722 Plantar fascial fibromatosis: Secondary | ICD-10-CM | POA: Diagnosis not present

## 2024-09-23 DIAGNOSIS — Z0130 Encounter for examination of blood pressure without abnormal findings: Secondary | ICD-10-CM

## 2024-09-23 MED ORDER — FLUTICASONE PROPIONATE 50 MCG/ACT NA SUSP: 1.0000 | Freq: Every day | NASAL | 2 refills | Status: AC

## 2024-09-23 MED ORDER — CETIRIZINE HCL 10 MG PO TABS: 10.0000 mg | ORAL_TABLET | Freq: Every day | ORAL | 2 refills | Status: AC

## 2024-09-23 MED ORDER — LEVOTHYROXINE SODIUM 150 MCG PO TABS: 150.0000 ug | ORAL_TABLET | Freq: Every day | ORAL | 1 refills | Status: AC

## 2024-09-23 NOTE — Progress Notes (Signed)
 "  Established Patient Office Visit  Subjective:  Patient ID: Peter Hansen, male    DOB: 17-Dec-1975  Age: 49 y.o. MRN: [id number]  Chief Complaint  Patient presents with   Follow-up    3 month lab results. Possible ear infection in the right ear starting bothering him last week.     No new complaints, here for lab review and medication refills. Labs reviewed and notable for elevated TSH, denies noncompliance with t4 or taking it with meals.     No other concerns at this time.   Past Medical History:  Diagnosis Date   Thyroid disease     Past Surgical History:  Procedure Laterality Date   CHOLECYSTECTOMY     COLONOSCOPY WITH PROPOFOL  N/A 10/24/2023   Procedure: COLONOSCOPY WITH PROPOFOL ;  Surgeon: Therisa Bi, MD;  Location: [facility] ENDOSCOPY;  Service: Gastroenterology;  Laterality: N/A;   KNEE ARTHROSCOPY     POLYPECTOMY  10/24/2023   Procedure: POLYPECTOMY;  Surgeon: Therisa Bi, MD;  Location: [facility] ENDOSCOPY;  Service: Gastroenterology;;    Social History   Socioeconomic History   Marital status: Married    Spouse name: Not on file   Number of children: 1   Years of education: Not on file   Highest education level: GED or equivalent  Occupational History   Not on file  Tobacco Use   Smoking status: Never   Smokeless tobacco: Never  Vaping Use   Vaping status: Never Used  Substance and Sexual Activity   Alcohol use: Not Currently    Comment: socially   Drug use: No   Sexual activity: Yes  Other Topics Concern   Not on file  Social History Narrative   Not on file   Social Drivers of Health   Tobacco Use: Low Risk (06/26/2024)   Patient History    Smoking Tobacco Use: Never    Smokeless Tobacco Use: Never    Passive Exposure: Not on file  Financial Resource Strain: Not on file  Food Insecurity: Not on file  Transportation Needs: Not on file  Physical Activity: Not on file  Stress: Not on file  Social Connections: Not on file  Intimate Partner Violence:  Not on file  Depression (PHQ2-9): Low Risk (06/04/2024)   Depression (PHQ2-9)    PHQ-2 Score: 0  Alcohol Screen: Not on file  Housing: Not on file  Utilities: Not on file  Health Literacy: Not on file    Family History  Problem Relation Age of Onset   Cancer Mother    Pancreatic cancer Mother    Cancer Father     Allergies[1]  Show/hide medication list[2]  Review of Systems  Constitutional:  Negative for malaise/fatigue and weight loss (gained 7 lbs).  HENT:  Positive for ear pain.   Eyes: Negative.   Respiratory: Negative.    Cardiovascular: Negative.   Gastrointestinal:  Positive for constipation.  Genitourinary: Negative.   Musculoskeletal:  Positive for joint pain (both knees).  Skin: Negative.   Neurological: Negative.   Endo/Heme/Allergies:  Positive for environmental allergies.  Psychiatric/Behavioral: Negative.         Objective:   BP 112/82   Pulse 72   Temp 98.2 F (36.8 C)   Ht 5' 7 (1.702 m)   Wt 177 lb 9.6 oz (80.6 kg)   SpO2 96%   BMI 27.82 kg/m   Vitals:   09/23/24 0940  BP: 112/82  Pulse: 72  Temp: 98.2 F (36.8 C)  Height: 5' 7 (1.702 m)  Weight: 177 lb 9.6 oz (80.6 kg)  SpO2: 96%  BMI (Calculated): 27.81    Physical Exam Vitals reviewed.  Constitutional:      Appearance: Normal appearance.  HENT:     Head: Normocephalic.     Right Ear: External ear normal. There is impacted cerumen. Tympanic membrane is not perforated, erythematous or bulging.     Left Ear: There is no impacted cerumen.     Nose: Nose normal.     Mouth/Throat:     Mouth: Mucous membranes are moist.     Pharynx: No posterior oropharyngeal erythema.  Eyes:     Extraocular Movements: Extraocular movements intact.     Pupils: Pupils are equal, round, and reactive to light.  Cardiovascular:     Rate and Rhythm: Regular rhythm.     Chest Wall: PMI is not displaced.     Pulses: Normal pulses.     Heart sounds: Normal heart sounds. No murmur  heard. Pulmonary:     Effort: Pulmonary effort is normal.     Breath sounds: Normal air entry. No rhonchi or rales.  Abdominal:     General: Abdomen is flat. Bowel sounds are normal. There is no distension.     Palpations: Abdomen is soft. There is no hepatomegaly, splenomegaly or mass.     Tenderness: There is no abdominal tenderness.  Musculoskeletal:        General: Normal range of motion.     Cervical back: Normal range of motion and neck supple.     Right lower leg: No edema.     Left lower leg: No edema.  Skin:    General: Skin is warm and dry.  Neurological:     General: No focal deficit present.     Mental Status: He is alert and oriented to person, place, and time.     Cranial Nerves: No cranial nerve deficit.     Motor: No weakness.  Psychiatric:        Mood and Affect: Mood normal.        Behavior: Behavior normal.      No results found for any visits on 09/23/24.  Recent Results (from the past 2160 hours)  Lipid panel     Status: None   Collection Time: 09/18/24  9:05 AM  Result Value Ref Range   Cholesterol, Total 168 100 - 199 mg/dL   Triglycerides 877 0 - 149 mg/dL   HDL 57 >60 mg/dL   VLDL Cholesterol Cal 22 5 - 40 mg/dL   LDL Chol Calc (NIH) 89 0 - 99 mg/dL   Chol/HDL Ratio 2.9 0.0 - 5.0 ratio    Comment:                                   T. Chol/HDL Ratio                                             Men  Women                               1/2 Avg.Risk  3.4    3.3  Avg.Risk  5.0    4.4                                2X Avg.Risk  9.6    7.1                                3X Avg.Risk 23.4   11.0   Comprehensive metabolic panel with GFR     Status: Abnormal   Collection Time: 09/18/24  9:05 AM  Result Value Ref Range   Glucose 94 70 - 99 mg/dL   BUN 10 6 - 24 mg/dL   Creatinine, Ser 9.40 (L) 0.76 - 1.27 mg/dL   eGFR 878 >40 fO/fpw/8.26   BUN/Creatinine Ratio 17 9 - 20   Sodium 141 134 - 144 mmol/L   Potassium 4.5  3.5 - 5.2 mmol/L   Chloride 104 96 - 106 mmol/L   CO2 17 (L) 20 - 29 mmol/L   Calcium 9.0 8.7 - 10.2 mg/dL   Total Protein 6.9 6.0 - 8.5 g/dL   Albumin 4.7 4.1 - 5.1 g/dL   Globulin, Total 2.2 1.5 - 4.5 g/dL   Bilirubin Total 0.8 0.0 - 1.2 mg/dL   Alkaline Phosphatase 34 (L) 47 - 123 IU/L   AST 28 0 - 40 IU/L   ALT 35 0 - 44 IU/L  TSH     Status: Abnormal   Collection Time: 09/18/24  9:05 AM  Result Value Ref Range   TSH 4.720 (H) 0.450 - 4.500 uIU/mL      Assessment & Plan:  Peter Hansen was seen today for follow-up.  Acquired hypothyroidism -     Levothyroxine  Sodium; Take 1 tablet (150 mcg total) by mouth daily.  Dispense: 90 tablet; Refill: 1  Plantar fasciitis of right foot  Blocked Eustachian tube, right -     Fluticasone  Propionate; Place 1 spray into both nostrils daily.  Dispense: 16 mL; Refill: 2 -     Cetirizine  HCl; Take 1 tablet (10 mg total) by mouth daily.  Dispense: 30 tablet; Refill: 2    Problem List Items Addressed This Visit       Endocrine   Acquired hypothyroidism - Primary   Relevant Medications   levothyroxine  (SYNTHROID ) 150 MCG tablet   Other Visit Diagnoses       Plantar fasciitis of right foot           Return in about 3 months (around 12/22/2024) for fu with labs prior.   Total time spent: 20 minutes. This time includes review of previous notes and results and patient face to face interaction during today'Kaneisha Ellenberger visit.    Sherrill Cinderella Perry, MD  09/23/2024   This document may have been prepared by [facility] Voice Recognition software and as such may include unintentional dictation errors.     [1] No Known Allergies [2]  Outpatient Medications Prior to Visit  Medication Sig   meloxicam  (MOBIC ) 15 MG tablet Take 1 tablet (15 mg total) by mouth daily.   [DISCONTINUED] levothyroxine  (SYNTHROID ) 125 MCG tablet Take 1 tablet (125 mcg total) by mouth daily.   [DISCONTINUED] methylPREDNISolone  (MEDROL  DOSEPAK) 4 MG TBPK tablet 6 day dose pack -  take as directed (Patient not taking: Reported on 09/23/2024)   No facility-administered medications prior to visit.   "

## 2024-12-31 ENCOUNTER — Ambulatory Visit: Admitting: Internal Medicine
